# Patient Record
Sex: Female | Born: 1990 | Race: White | Hispanic: No | Marital: Single | Smoking: Never smoker
Health system: Southern US, Community
[De-identification: ages and names within clinical notes are randomized; demographics above are authoritative.]

## PROBLEM LIST (undated history)

## (undated) DIAGNOSIS — K279 Peptic ulcer, site unspecified, unspecified as acute or chronic, without hemorrhage or perforation: Secondary | ICD-10-CM

## (undated) DIAGNOSIS — K2970 Gastritis, unspecified, without bleeding: Secondary | ICD-10-CM

## (undated) HISTORY — PX: TUBAL LIGATION: SHX77

---
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
## 2011-08-29 IMAGING — CR DG CHEST 2V
2 series · 2 of 2 positions shown · non-contrast
Comparison: 05/28/2010

CLINICAL DATA: Chest pain

CHEST - 2 VIEW

[view not recorded (1 of 2)]
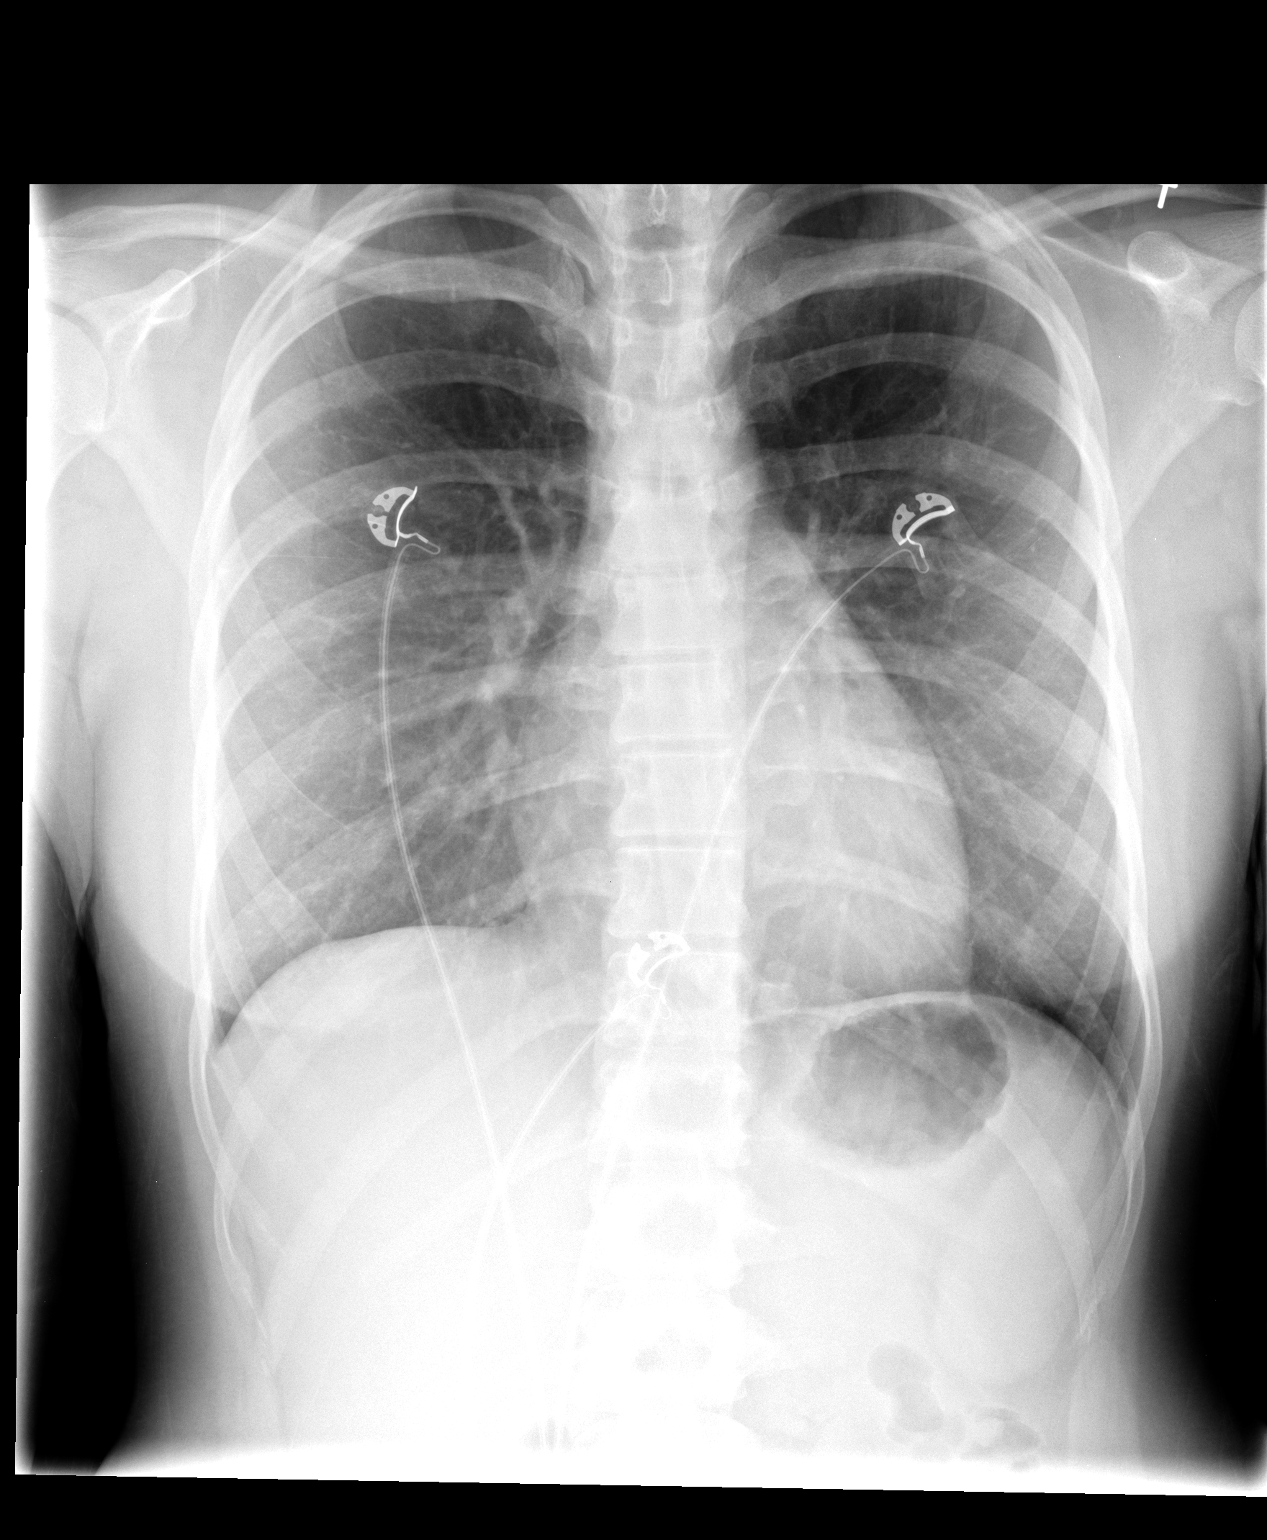

[view not recorded (2 of 2)]
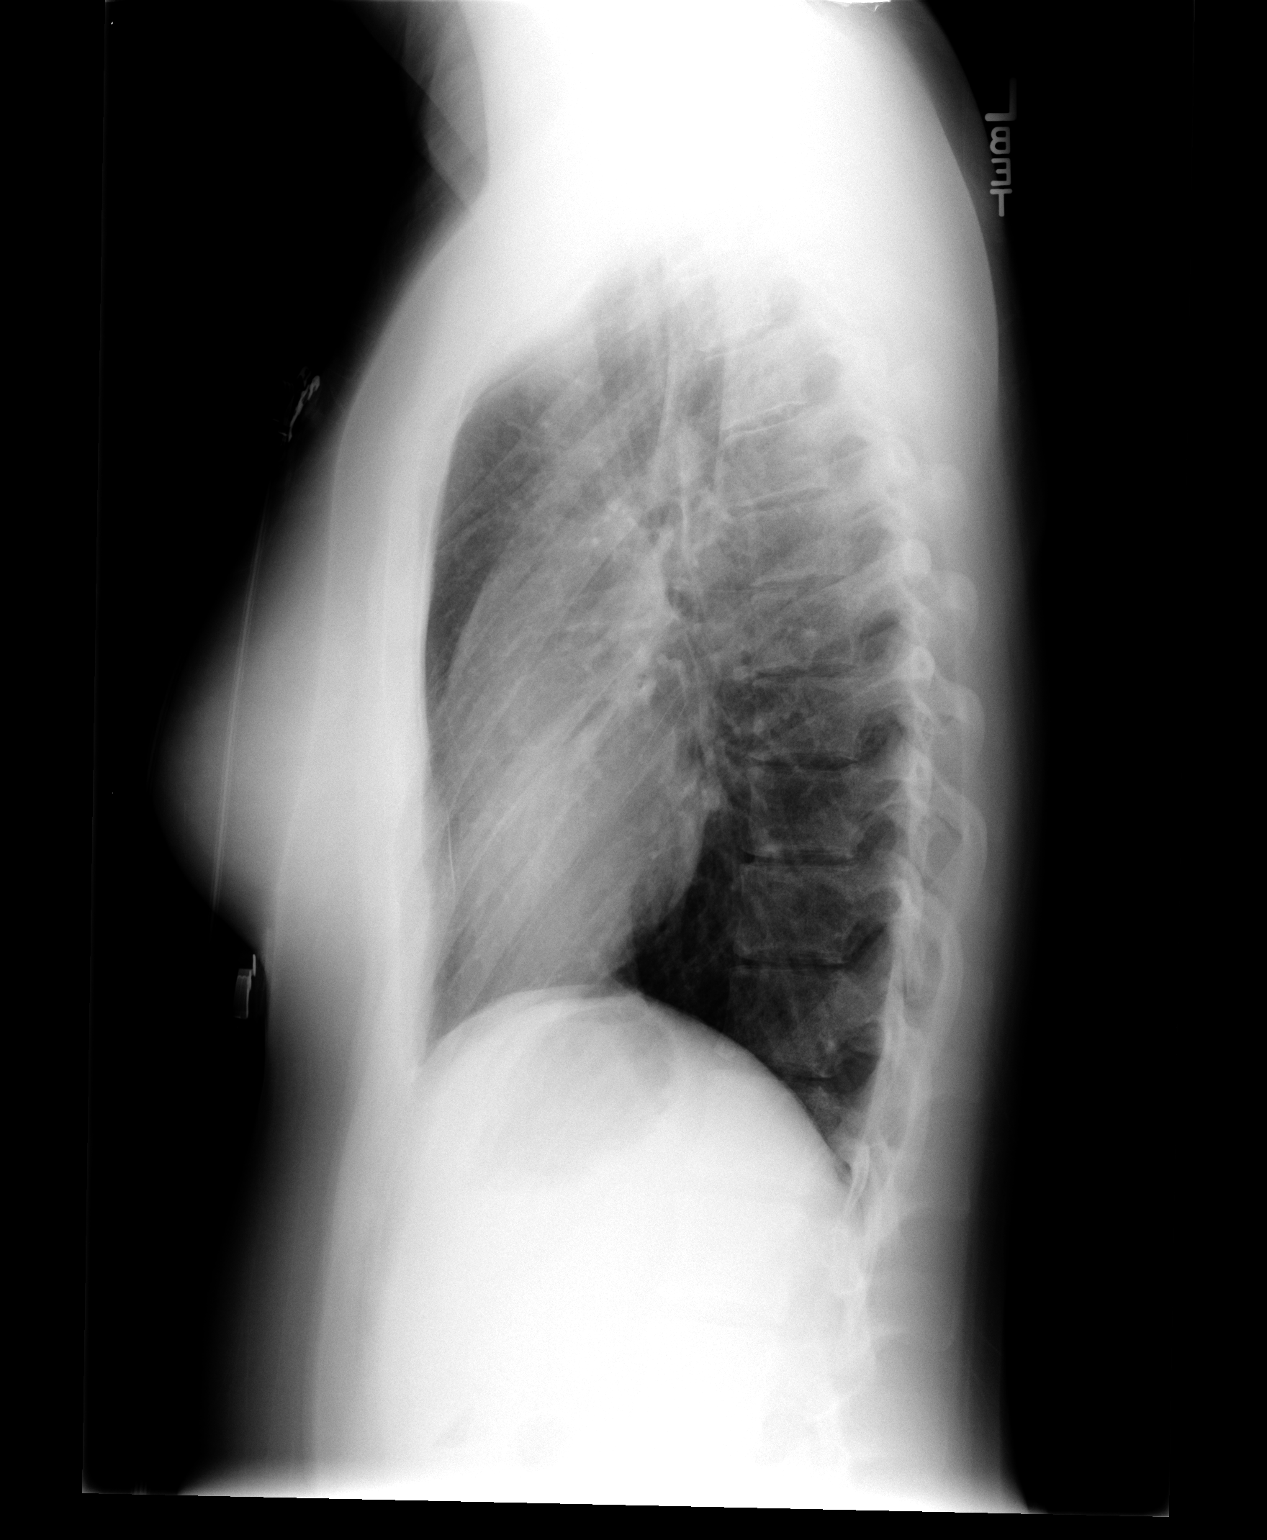

[2 of 2 positions shown; findings below may reference images not displayed]

FINDINGS: The lungs are clear bilaterally.  No confluent airspace
opacities, pleural effuions or pneumothoracies are seen.  The heart
is normal in size and contour.  The upper abdomen and osseous
structures are normal.
IMPRESSION: No acute cardiopulmonary disease.

## 2011-09-13 ENCOUNTER — Ambulatory Visit: Payer: Self-pay | Admitting: Orthopedic Surgery

## 2013-04-27 ENCOUNTER — Inpatient Hospital Stay (HOSPITAL_COMMUNITY)
Admission: AD | Admit: 2013-04-27 | Discharge: 2013-04-27 | Discharge status: Against Medical Advice | Payer: Medicaid Other | Attending: Obstetrics & Gynecology | Admitting: Obstetrics & Gynecology

## 2020-06-06 ENCOUNTER — Other Ambulatory Visit: Payer: Self-pay

## 2020-06-06 ENCOUNTER — Ambulatory Visit
Admission: EM | Admit: 2020-06-06 | Discharge: 2020-06-06 | Disposition: A | Discharge status: Home / Self Care | Payer: Medicaid Other | Attending: Emergency Medicine | Admitting: Emergency Medicine

## 2020-06-06 ENCOUNTER — Encounter: Payer: Self-pay | Admitting: Emergency Medicine

## 2020-06-06 DIAGNOSIS — R059 Cough, unspecified: Secondary | ICD-10-CM | POA: Diagnosis not present

## 2020-06-06 DIAGNOSIS — J019 Acute sinusitis, unspecified: Secondary | ICD-10-CM | POA: Diagnosis present

## 2020-06-06 HISTORY — DX: Peptic ulcer, site unspecified, unspecified as acute or chronic, without hemorrhage or perforation: K27.9

## 2020-06-06 HISTORY — DX: Gastritis, unspecified, without bleeding: K29.70

## 2020-06-06 LAB — POCT RAPID STREP A (OFFICE): Rapid Strep A Screen: NEGATIVE

## 2020-06-06 MED ORDER — DOXYCYCLINE HYCLATE 100 MG PO CAPS: 100.0000 mg | ORAL_CAPSULE | Freq: Two times a day (BID) | ORAL | 0 refills | Status: AC

## 2020-06-06 NOTE — ED Provider Notes (Signed)
[facility]   [id number] 06/06/20 Arrival Time: 1810   CC: COVID symptoms  SUBJECTIVE: History from: patient.  Kathy Chan is a 29 y.o. female who presents with sore throat, sinus drainage, sinus pain/ pressure, and cough x 1 week.  Denies sick exposure to COVID, flu or strep.  Has tried OTC medications.  Denies aggravating factors.  Reports previous COVID infection in the past.   Denies SOB, wheezing, chest pain, nausea, changes in bowel or bladder habits.    ROS: As per HPI.  All other pertinent ROS negative.     Past Medical History:  Diagnosis Date  . Gastritis   . Peptic ulcer    Past Surgical History:  Procedure Laterality Date  . TUBAL LIGATION     Allergies  Allergen Reactions  . Penicillins Rash   No current facility-administered medications on file prior to encounter.   No current outpatient medications on file prior to encounter.   Social History   Socioeconomic History  . Marital status: Single    Spouse name: Not on file  . Number of children: Not on file  . Years of education: Not on file  . Highest education level: Not on file  Occupational History  . Not on file  Tobacco Use  . Smoking status: Never Smoker  . Smokeless tobacco: Never Used  Substance and Sexual Activity  . Alcohol use: Never  . Drug use: Never  . Sexual activity: Not on file  Other Topics Concern  . Not on file  Social History Narrative  . Not on file   Social Determinants of Health   Financial Resource Strain:   . Difficulty of Paying Living Expenses: Not on file  Food Insecurity:   . Worried About Programme researcher, broadcasting/film/video in the Last Year: Not on file  . Ran Out of Food in the Last Year: Not on file  Transportation Needs:   . Lack of Transportation (Medical): Not on file  . Lack of Transportation (Non-Medical): Not on file  Physical Activity:   . Days of Exercise per Week: Not on file  . Minutes of Exercise per Session: Not on file  Stress:   . Feeling of  Stress : Not on file  Social Connections:   . Frequency of Communication with Friends and Family: Not on file  . Frequency of Social Gatherings with Friends and Family: Not on file  . Attends Religious Services: Not on file  . Active Member of Clubs or Organizations: Not on file  . Attends Banker Meetings: Not on file  . Marital Status: Not on file  Intimate Partner Violence:   . Fear of Current or Ex-Partner: Not on file  . Emotionally Abused: Not on file  . Physically Abused: Not on file  . Sexually Abused: Not on file   No family history on file.  OBJECTIVE:  Vitals:   06/06/20 1837  BP: 128/84  Pulse: 98  Resp: 18  Temp: 98.3 F (36.8 C)  TempSrc: Oral  SpO2: 97%  Weight: 155 lb (70.3 kg)  Height: 5\' 4"  (1.626 m)    General appearance: alert; appears fatigued, but nontoxic; speaking in full sentences and tolerating own secretions HEENT: NCAT; Ears: EACs clear, TMs pearly gray; Eyes: PERRL.  EOM grossly intact. Sinuses: TTP; Nose: nares patent without rhinorrhea, Throat: oropharynx clear, tonsils non erythematous or enlarged, uvula midline  Neck: supple without LAD Lungs: unlabored respirations, symmetrical air entry; cough: mild; no respiratory distress; CTAB Heart:  regular rate and rhythm.  Skin: warm and dry Psychological: alert and cooperative; normal mood and affect  ASSESSMENT & PLAN:  1. Cough   2. Acute non-recurrent sinusitis, unspecified location     Meds ordered this encounter  Medications  . doxycycline (VIBRAMYCIN) 100 MG capsule    Sig: Take 1 capsule (100 mg total) by mouth 2 (two) times daily.    Dispense:  20 capsule    Refill:  0    Order Specific Question:   Supervising Provider    Answer:   Eustace Moore [[id number]]   COVID testing ordered.  It will take between 5-7 days for test results.  Someone will contact you regarding abnormal results.    In the meantime: You should remain isolated in your home for 10 days from  symptom onset AND greater than 72 hours after symptoms resolution (absence of fever without the use of fever-reducing medication and improvement in respiratory symptoms), whichever is longer Get plenty of rest and push fluids Tessalon Perles prescribed for cough Doxycycline for sinus infection Use OTC zyrtec for nasal congestion, runny nose, and/or sore throat Use OTC flonase for nasal congestion and runny nose Use medications daily for symptom relief Use OTC medications like ibuprofen or tylenol as needed fever or pain Call or go to the ED if you have any new or worsening symptoms such as fever, worsening cough, shortness of breath, chest tightness, chest pain, turning blue, changes in mental status, etc...   Reviewed expectations re: course of current medical issues. Questions answered. Outlined signs and symptoms indicating need for more acute intervention. Patient verbalized understanding. After Visit Summary given.         Rennis Harding, PA-C 06/06/20 1855

## 2020-06-06 NOTE — Discharge Instructions (Signed)
COVID testing ordered.  It will take between 5-7 days for test results.  Someone will contact you regarding abnormal results.    In the meantime: You should remain isolated in your home for 10 days from symptom onset AND greater than 72 hours after symptoms resolution (absence of fever without the use of fever-reducing medication and improvement in respiratory symptoms), whichever is longer Get plenty of rest and push fluids Tessalon Perles prescribed for cough Doxycycline for sinus infection Use OTC zyrtec for nasal congestion, runny nose, and/or sore throat Use OTC flonase for nasal congestion and runny nose Use medications daily for symptom relief Use OTC medications like ibuprofen or tylenol as needed fever or pain Call or go to the ED if you have any new or worsening symptoms such as fever, worsening cough, shortness of breath, chest tightness, chest pain, turning blue, changes in mental status, etc..Marland Kitchen

## 2020-06-06 NOTE — ED Triage Notes (Signed)
Sore throat, sinus drainage and cough since last Sunday.

## 2020-06-07 LAB — SPECIMEN STATUS REPORT

## 2020-06-07 LAB — SARS-COV-2, NAA 2 DAY TAT

## 2020-06-07 LAB — NOVEL CORONAVIRUS, NAA: SARS-CoV-2, NAA: NOT DETECTED

## 2020-06-09 LAB — CULTURE, GROUP A STREP (THRC): Special Requests: NORMAL

## 2021-04-17 ENCOUNTER — Encounter: Payer: Self-pay | Admitting: Internal Medicine

## 2021-08-30 ENCOUNTER — Ambulatory Visit: Payer: Medicaid Other | Admitting: Gastroenterology
